# Patient Record
Sex: Male | Born: 2000 | Race: White | Hispanic: No | Marital: Single | Smoking: Never smoker
Health system: Southern US, Community
[De-identification: ages and names within clinical notes are randomized; demographics above are authoritative.]

## PROBLEM LIST (undated history)

## (undated) DIAGNOSIS — R519 Headache, unspecified: Secondary | ICD-10-CM

## (undated) DIAGNOSIS — T7840XA Allergy, unspecified, initial encounter: Secondary | ICD-10-CM

## (undated) DIAGNOSIS — R51 Headache: Secondary | ICD-10-CM

## (undated) DIAGNOSIS — R040 Epistaxis: Secondary | ICD-10-CM

## (undated) DIAGNOSIS — J45909 Unspecified asthma, uncomplicated: Secondary | ICD-10-CM

## (undated) HISTORY — DX: Allergy, unspecified, initial encounter: T78.40XA

## (undated) HISTORY — DX: Unspecified asthma, uncomplicated: J45.909

## (undated) HISTORY — PX: NO PAST SURGERIES: SHX2092

## (undated) HISTORY — DX: Epistaxis: R04.0

## (undated) HISTORY — DX: Headache, unspecified: R51.9

## (undated) HISTORY — DX: Headache: R51

---
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
## 2020-09-13 DIAGNOSIS — S53402A Unspecified sprain of left elbow, initial encounter: Secondary | ICD-10-CM | POA: Diagnosis not present

## 2020-09-20 DIAGNOSIS — Z23 Encounter for immunization: Secondary | ICD-10-CM | POA: Diagnosis not present

## 2021-01-05 DIAGNOSIS — H52223 Regular astigmatism, bilateral: Secondary | ICD-10-CM | POA: Diagnosis not present

## 2021-11-10 DIAGNOSIS — M542 Cervicalgia: Secondary | ICD-10-CM | POA: Diagnosis not present

## 2021-11-10 DIAGNOSIS — M25511 Pain in right shoulder: Secondary | ICD-10-CM | POA: Diagnosis not present

## 2021-11-23 DIAGNOSIS — M542 Cervicalgia: Secondary | ICD-10-CM | POA: Diagnosis not present

## 2021-11-23 DIAGNOSIS — M546 Pain in thoracic spine: Secondary | ICD-10-CM | POA: Diagnosis not present

## 2021-11-30 DIAGNOSIS — M546 Pain in thoracic spine: Secondary | ICD-10-CM | POA: Diagnosis not present

## 2021-11-30 DIAGNOSIS — M542 Cervicalgia: Secondary | ICD-10-CM | POA: Diagnosis not present

## 2021-12-07 DIAGNOSIS — M542 Cervicalgia: Secondary | ICD-10-CM | POA: Diagnosis not present

## 2021-12-07 DIAGNOSIS — M546 Pain in thoracic spine: Secondary | ICD-10-CM | POA: Diagnosis not present

## 2021-12-27 ENCOUNTER — Ambulatory Visit: Payer: 59 | Admitting: Family Medicine

## 2021-12-27 ENCOUNTER — Encounter: Payer: Self-pay | Admitting: Family Medicine

## 2021-12-27 VITALS — BP 110/72 | HR 70 | Temp 97.6°F | Ht 69.5 in | Wt 151.8 lb

## 2021-12-27 DIAGNOSIS — Z1329 Encounter for screening for other suspected endocrine disorder: Secondary | ICD-10-CM | POA: Diagnosis not present

## 2021-12-27 DIAGNOSIS — Z13220 Encounter for screening for lipoid disorders: Secondary | ICD-10-CM | POA: Diagnosis not present

## 2021-12-27 DIAGNOSIS — Z13228 Encounter for screening for other metabolic disorders: Secondary | ICD-10-CM | POA: Diagnosis not present

## 2021-12-27 DIAGNOSIS — Z23 Encounter for immunization: Secondary | ICD-10-CM

## 2021-12-27 DIAGNOSIS — Z130 Encounter for screening for diseases of the blood and blood-forming organs and certain disorders involving the immune mechanism: Secondary | ICD-10-CM

## 2021-12-27 DIAGNOSIS — Z1159 Encounter for screening for other viral diseases: Secondary | ICD-10-CM

## 2021-12-27 DIAGNOSIS — Z1321 Encounter for screening for nutritional disorder: Secondary | ICD-10-CM

## 2021-12-27 LAB — LIPID PANEL
Cholesterol: 115 mg/dL (ref 0–200)
HDL: 48.3 mg/dL (ref >39.00)
LDL Cholesterol: 59 mg/dL (ref 0–99)
NonHDL: 66.3
Total CHOL/HDL Ratio: 2
Triglycerides: 39 mg/dL (ref 0.0–149.0)
VLDL: 7.8 mg/dL (ref 0.0–40.0)

## 2021-12-27 LAB — BASIC METABOLIC PANEL
BUN: 23 mg/dL (ref 6–23)
CO2: 32 mEq/L (ref 19–32)
Calcium: 9.4 mg/dL (ref 8.4–10.5)
Chloride: 102 mEq/L (ref 96–112)
Creatinine, Ser: 0.91 mg/dL (ref 0.40–1.50)
GFR: 120.65 mL/min (ref >60.00)
Glucose, Bld: 77 mg/dL (ref 70–99)
Potassium: 4.3 mEq/L (ref 3.5–5.1)
Sodium: 138 mEq/L (ref 135–145)

## 2021-12-27 NOTE — Progress Notes (Signed)
Chief Complaint:  Clifford Stephens is a 21 y.o. male who presents today for his annual comprehensive physical exam.    Assessment/Plan:   Problem List Items Addressed This Visit   None Visit Diagnoses     Need for influenza vaccination    -  Primary   Relevant Orders   Flu Vaccine QUAD 6+ mos PF IM (Fluarix Quad PF)   Screening for endocrine, nutritional, metabolic and immunity disorder       Relevant Orders   Basic Metabolic Panel (BMET) (Completed)   Screening, lipid       Relevant Orders   Lipid Profile (Completed)   Screening for viral disease       Relevant Orders   Hepatitis C Antibody (Completed)   HIV antibody (with reflex) (Completed)        Patient Counseling(The following topics were reviewed and/or handout was given):  -Nutrition: Stressed importance of moderation in sodium/caffeine intake, saturated fat and cholesterol, caloric balance, sufficient intake of fresh fruits, vegetables, and fiber.  -Stressed the importance of regular exercise.   -Substance Abuse: Discussed cessation/primary prevention of tobacco, alcohol, or other drug use; driving or other dangerous activities under the influence; availability of treatment for abuse.   -Injury prevention: Discussed safety belts, safety helmets, smoke detector, smoking near bedding or upholstery.   -Sexuality: Discussed sexually transmitted diseases, partner selection, use of condoms, avoidance of unintended pregnancy and contraceptive alternatives.   -Dental health: Discussed importance of regular tooth brushing, flossing, and dental visits.  -Health maintenance and immunizations reviewed. Please refer to Health maintenance section.  Return to care in 1 year for next preventative visit.     Subjective:  HPI:  He has no acute complaints today.   Patient is here to establish care.  Patient goes to [place] [employer] to study Patent attorney.  He has mother is [place].  He speaks spanish.  He grew up in [place].   He is planning to go to [place] in the spring and complete his care.   Lifestyle Diet: Eats a healthy balanced diet vegetables Exercise: He has played baseball in the past, but he is currently focusing on his education     12/27/2021   10:48 AM  Depression screen PHQ 2/9  Decreased Interest 0  Down, Depressed, Hopeless 0  PHQ - 2 Score 0  Altered sleeping 0  Tired, decreased energy 0  Change in appetite 0  Feeling bad or failure about yourself  0  Trouble concentrating 0  Moving slowly or fidgety/restless 0  Suicidal thoughts 0  PHQ-9 Score 0  Difficult doing work/chores Not difficult at all    Health Maintenance Due  Topic Date Due   HPV VACCINES (1 - Male 2-dose series) Never done   DTaP/Tdap/Td (1 - Tdap) Never done   INFLUENZA VACCINE  08/30/2021     ROS: Denies chest pain or shortness of breath, otherwise all systems reviewed and are negative  PMH:  The following were reviewed and entered/updated in epic: Past Medical History:  Diagnosis Date   Allergy    Asthma    Epistaxis    Headache    There are no problems to display for this patient.  Past Surgical History:  Procedure Laterality Date   NO PAST SURGERIES      Family History  Problem Relation Age of Onset   Hypertension Mother    Hemangiomas Mother        cavernous   Hashimoto's thyroiditis Mother  Hypertension Maternal Grandmother    Hypertension Maternal Grandfather    Cancer Maternal Grandfather    Hypertension Paternal Grandmother    Hypertension Paternal Grandfather     Medications- reviewed and updated Current Outpatient Medications  Medication Sig Dispense Refill   loratadine (CLARITIN) 10 MG tablet Take 10 mg by mouth daily.     No current facility-administered medications for this visit.    Allergies-reviewed and updated Allergies  Allergen Reactions   Penicillins     Familial reaction anaphylaxis/death    Social History   Socioeconomic History   Marital status:  Single    Spouse name: n/a   Number of children: 0   Years of education: Not on file   Highest education level: Not on file  Occupational History   Occupation: student  Tobacco Use   Smoking status: Never    Passive exposure: Never   Smokeless tobacco: Never  Vaping Use   Vaping Use: Never used  Substance and Sexual Activity   Alcohol use: Yes    Comment: Once a week   Drug use: Yes    Types: Marijuana   Sexual activity: Never  Other Topics Concern   Not on file  Social History Narrative   Lives with both parents and two siblings.   Attends [employer].   If baseball doesn't work out, he'd like to become a Adult nurse or Radio producer.   Social Determinants of Health   Financial Resource Strain: Not on file  Food Insecurity: Not on file  Transportation Needs: Not on file  Physical Activity: Not on file  Stress: Not on file  Social Connections: Not on file        Objective:  Physical Exam: BP 110/72 (BP Location: Right Arm, Patient Position: Sitting, Cuff Size: Large)   Pulse 70   Temp 97.6 F (36.4 C) (Temporal)   Ht 5' 9.5" (1.765 m)   Wt 151 lb 12.8 oz (68.9 kg)   SpO2 98%   BMI 22.10 kg/m   Body mass index is 22.1 kg/m. Wt Readings from Last 3 Encounters:  12/27/21 151 lb 12.8 oz (68.9 kg)  10/18/16 149 lb 11.1 oz (67.9 kg) (71 %, Z= 0.56)*  03/13/16 141 lb 9.6 oz (64.2 kg) (69 %, Z= 0.48)*   * Growth percentiles are based on CDC (Boys, 2-20 Years) data.    Gen: NAD, resting comfortably CV: RRR with no murmurs appreciated Pulm: NWOB, CTAB with no crackles, wheezes, or rhonchi GI: Normal bowel sounds present. Soft, Nontender, Nondistended. MSK: no edema, cyanosis, or clubbing noted Skin: warm, dry Neuro: grossly normal, moves all extremities Psych: Normal affect and thought content      At today's visit, we discussed treatment options, associated risk and benefits, and engage in counseling as needed.  Additionally the  following were reviewed: Past medical records, past medical and surgical history, family and social background, as well as relevant laboratory results, imaging findings, and specialty notes, where applicable.  This message was generated using dictation software, and as a result, it may contain unintentional typos or errors.  Nevertheless, extensive effort was made to accurately convey at the pertinent aspects of the patient visit.    There may have been are other unrelated non-urgent complaints, but due to the busy schedule and the amount of time already spent with him, time does not permit to address these issues at today's visit. Another appointment may have or has been requested to review these additional issues.  Marny Lowenstein, MD, MS

## 2021-12-28 LAB — HIV ANTIBODY (ROUTINE TESTING W REFLEX): HIV 1&2 Ab, 4th Generation: NONREACTIVE

## 2021-12-28 LAB — HEPATITIS C ANTIBODY: Hepatitis C Ab: NONREACTIVE

## 2022-07-24 DIAGNOSIS — H52223 Regular astigmatism, bilateral: Secondary | ICD-10-CM | POA: Diagnosis not present

## 2023-01-01 ENCOUNTER — Ambulatory Visit (INDEPENDENT_AMBULATORY_CARE_PROVIDER_SITE_OTHER): Payer: BC Managed Care – PPO | Admitting: Family Medicine

## 2023-01-01 ENCOUNTER — Encounter: Payer: Self-pay | Admitting: Family Medicine

## 2023-01-01 VITALS — BP 126/74 | HR 60 | Temp 97.9°F | Ht 69.5 in | Wt 148.8 lb

## 2023-01-01 DIAGNOSIS — Z862 Personal history of diseases of the blood and blood-forming organs and certain disorders involving the immune mechanism: Secondary | ICD-10-CM | POA: Insufficient documentation

## 2023-01-01 DIAGNOSIS — Z0001 Encounter for general adult medical examination with abnormal findings: Secondary | ICD-10-CM | POA: Diagnosis not present

## 2023-01-01 DIAGNOSIS — S01331A Puncture wound without foreign body of right ear, initial encounter: Secondary | ICD-10-CM | POA: Insufficient documentation

## 2023-01-01 DIAGNOSIS — Z23 Encounter for immunization: Secondary | ICD-10-CM

## 2023-01-01 DIAGNOSIS — Z0000 Encounter for general adult medical examination without abnormal findings: Secondary | ICD-10-CM

## 2023-01-01 LAB — CBC WITH DIFFERENTIAL/PLATELET
Basophils Absolute: 0.1 10*3/uL (ref 0.0–0.1)
Basophils Relative: 1 % (ref 0.0–3.0)
Eosinophils Absolute: 0 10*3/uL (ref 0.0–0.7)
Eosinophils Relative: 0.8 % (ref 0.0–5.0)
HCT: 48.4 % (ref 39.0–52.0)
Hemoglobin: 16.4 g/dL (ref 13.0–17.0)
Lymphocytes Relative: 26 % (ref 12.0–46.0)
Lymphs Abs: 1.5 10*3/uL (ref 0.7–4.0)
MCHC: 33.9 g/dL (ref 30.0–36.0)
MCV: 86 fL (ref 78.0–100.0)
Monocytes Absolute: 0.5 10*3/uL (ref 0.1–1.0)
Monocytes Relative: 9 % (ref 3.0–12.0)
Neutro Abs: 3.5 10*3/uL (ref 1.4–7.7)
Neutrophils Relative %: 63.2 % (ref 43.0–77.0)
Platelets: 229 10*3/uL (ref 150.0–400.0)
RBC: 5.63 Mil/uL (ref 4.22–5.81)
RDW: 12.7 % (ref 11.5–15.5)
WBC: 5.6 10*3/uL (ref 4.0–10.5)

## 2023-01-01 LAB — COMPREHENSIVE METABOLIC PANEL
ALT: 17 U/L (ref 0–53)
AST: 18 U/L (ref 0–37)
Albumin: 4.8 g/dL (ref 3.5–5.2)
Alkaline Phosphatase: 82 U/L (ref 39–117)
BUN: 14 mg/dL (ref 6–23)
CO2: 29 meq/L (ref 19–32)
Calcium: 9.8 mg/dL (ref 8.4–10.5)
Chloride: 101 meq/L (ref 96–112)
Creatinine, Ser: 0.97 mg/dL (ref 0.40–1.50)
GFR: 110.96 mL/min (ref >60.00)
Glucose, Bld: 94 mg/dL (ref 70–99)
Potassium: 4 meq/L (ref 3.5–5.1)
Sodium: 138 meq/L (ref 135–145)
Total Bilirubin: 0.7 mg/dL (ref 0.2–1.2)
Total Protein: 8 g/dL (ref 6.0–8.3)

## 2023-01-01 LAB — HIGH SENSITIVITY CRP: CRP, High Sensitivity: <0.2 mg/L (ref 0.000–5.000)

## 2023-01-01 LAB — SEDIMENTATION RATE: Sed Rate: 2 mm/h (ref 0–15)

## 2023-01-01 MED ORDER — CIPROFLOXACIN HCL 250 MG PO TABS: 250.0000 mg | ORAL_TABLET | Freq: Two times a day (BID) | ORAL | 0 refills | Status: AC

## 2023-01-01 NOTE — Progress Notes (Signed)
Assessment  Assessment/Plan:   Problem List Items Addressed This Visit       Other   Complication of right ear piercing    5 months of persistent signs of localized infection at the site of right ear piercings, including swelling, redness, and purulent discharge upon pressure. No significant pain or systemic symptoms are reported.   Differential Diagnosis:  Localized cellulitis vs. abscess due to piercing Allergic reaction to piercing material vs foreign body reaction: Less likely as the patient has worn silver jewelry previously without issues.  Plan: Advised removal of right ear piercings to facilitate healing and prevent further infection. Ciprofloxacin 250?mg orally twice daily for 7 days. Labs to assess for underlying infection: CBC, ESR, CRP Instructed to avoid manipulating the affected area and maintain proper hygiene. Advised on the signs of worsening infection and the importance of prompt medical attention if symptoms escalate. ENT Consultation to be considered if no improvement is observed or if symptoms worsen despite treatment.      Relevant Medications   ciprofloxacin (CIPRO) 250 MG tablet   Other Relevant Orders   Sedimentation rate   CRP High sensitivity   CBC w/Diff   Comp Met (CMET)   History of polycythemia   Other Visit Diagnoses     Encounter for well adult exam without abnormal findings    -  Primary   Immunization due       Relevant Orders   Flu vaccine trivalent PF, 6mos and older(Flulaval,Afluria,Fluarix,Fluzone) (Completed)   Tdap vaccine greater than or equal to 7yo IM (Completed)       There are no discontinued medications.  Patient Counseling(The following topics were reviewed and/or handout was given):  -Nutrition: Stressed importance of moderation in sodium/caffeine intake, saturated fat and cholesterol, caloric balance, sufficient intake of fresh fruits, vegetables, and fiber.  -Stressed the importance of regular exercise.    -Substance Abuse: Discussed cessation/primary prevention of tobacco, alcohol, or other drug use; driving or other dangerous activities under the influence; availability of treatment for abuse.   -Injury prevention: Discussed safety belts, safety helmets, smoke detector, smoking near bedding or upholstery.   -Sexuality: Discussed sexually transmitted diseases, partner selection, use of condoms, avoidance of unintended pregnancy and contraceptive alternatives.   -Dental health: Discussed importance of regular tooth brushing, flossing, and dental visits.  -Health maintenance and immunizations reviewed. Please refer to Health maintenance section.  Return to care in 1 year for next preventative visit.        Subjective:  Chief complaint Encounter date: 01/01/2023  Chief Complaint  Patient presents with   Annual Exam    Fasting   Chief Complaint: Annual physical examination and concern about right ear infection.  History of Present Illness:  Patient presents for an annual physical examination and reports a concern about a possible infection at the site of right ear piercings. The piercings were obtained in May 2024. The patient notes onset of symptoms around July 2024, approximately five months ago. Symptoms include persistent swelling at the site of the upper ear piercings on the right ear. Minimal pain is reported, but the patient notices pus drainage when pressure is applied to the area. The condition has remained unchanged over the past several months. The patient has not removed the earrings due to concern about the piercings closing. Denies fever or chills but reports occasional hot sweats at night when unable to sleep. No history of similar reactions to silver jewelry.  Review of Systems:  Constitutional: Denies fever or chills.  Reports occasional hot sweats at night when unable to sleep. HEENT: Right ear swelling with pus drainage at piercing site. Denies significant pain. Skin:  Swelling and redness at the right ear piercing site. Remainder of ROS negative.  Health Maintenance:  Dental: Up to date; last dental visit in March 2024. Vision: Up to date; last eye examination two months ago.  Past Medical History:  Mildly elevated blood counts noted in past laboratory results in 2027.  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 Transferred from [employer], previously studied Patent attorney. Living Situation: Lives with significant other. Travel Plans: Planning to visit family in [place] in two weeks.       01/01/2023   10:09 AM 12/27/2021   10:48 AM 03/13/2016    5:16 PM 03/22/2015    2:16 PM 06/06/2014    3:29 PM  Depression screen PHQ 2/9  Decreased Interest 1 0 0 0 0  Down, Depressed, Hopeless 2 0 0 0 0  PHQ - 2 Score 3 0 0 0 0  Altered sleeping 2 0 0    Tired, decreased energy 1 0 0    Change in appetite 1 0 0    Feeling bad or failure about yourself  1 0 0    Trouble concentrating 2 0 0    Moving slowly or fidgety/restless 0 0 0    Suicidal thoughts 0 0 0    PHQ-9 Score 10 0 0    Difficult doing work/chores Not difficult at all Not difficult at all          01/01/2023   10:09 AM 12/27/2021   10:49 AM  GAD 7 : Generalized Anxiety Score  Nervous, Anxious, on Edge 3 0  Control/stop worrying 3 3  Worry too much - different things 3 3  Trouble relaxing 2 2  Restless 1 0  Easily annoyed or irritable 2 1  Afraid - awful might happen 1 0  Total GAD 7 Score 15 9  Anxiety Difficulty Not difficult at all Not difficult at all    There are no preventive care reminders to display for this patient.      PMH:  The following were reviewed and entered/updated in epic: Past Medical History:  Diagnosis Date   Allergy    Asthma    Epistaxis    Headache     Patient Active Problem List   Diagnosis Date Noted    Complication of right ear piercing 01/01/2023   History of polycythemia 01/01/2023    Past Surgical History:  Procedure Laterality Date   NO PAST SURGERIES      Family History  Problem Relation Age of Onset   Hypertension Mother    Hemangiomas Mother        cavernous   Hashimoto's thyroiditis Mother    Hypertension Maternal Grandmother    Hypertension Maternal Grandfather    Cancer Maternal Grandfather    Hypertension Paternal Grandmother    Hypertension Paternal Grandfather     Medications- reviewed and updated Outpatient Medications Prior to Visit  Medication Sig Dispense Refill   loratadine (CLARITIN) 10 MG tablet Take 10 mg by mouth daily.     No facility-administered medications prior to visit.    Allergies  Allergen Reactions   Penicillins     Familial reaction anaphylaxis/death    Social History   Socioeconomic History   Marital status: Single  Spouse name: n/a   Number of children: 0   Years of education: Not on file   Highest education level: Not on file  Occupational History   Occupation: student  Tobacco Use   Smoking status: Never    Passive exposure: Never   Smokeless tobacco: Never  Vaping Use   Vaping status: Never Used  Substance and Sexual Activity   Alcohol use: Yes    Comment: Once a week   Drug use: Yes    Types: Marijuana   Sexual activity: Never  Other Topics Concern   Not on file  Social History Narrative   Lives with both parents and two siblings.   Attends [employer].   If baseball doesn't work out, he'd like to become a Adult nurse or Radio producer.   Social Determinants of Health   Financial Resource Strain: Not on file  Food Insecurity: Not on file  Transportation Needs: Not on file  Physical Activity: Not on file  Stress: Not on file  Social Connections: Not on file           Objective:  Physical Exam: BP 126/74 (BP Location: Left Arm, Patient Position: Sitting, Cuff Size:  Large)   Pulse 60   Temp 97.9 F (36.6 C) (Temporal)   Ht 5' 9.5" (1.765 m)   Wt 148 lb 12.8 oz (67.5 kg)   SpO2 97%   BMI 21.66 kg/m   Body mass index is 21.66 kg/m. Wt Readings from Last 3 Encounters:  01/01/23 148 lb 12.8 oz (67.5 kg)  12/27/21 151 lb 12.8 oz (68.9 kg)  10/18/16 149 lb 11.1 oz (67.9 kg) (71%, Z= 0.56)*   * Growth percentiles are based on CDC (Boys, 2-20 Years) data.    Physical Exam Constitutional:      General: He is not in acute distress.    Appearance: Normal appearance. He is not ill-appearing or toxic-appearing.  HENT:     Head: Normocephalic and atraumatic.     Right Ear: Hearing, tympanic membrane, ear canal and external ear normal. Swelling present. No drainage. There is impacted cerumen.     Left Ear: Hearing, tympanic membrane, ear canal and external ear normal. There is no impacted cerumen.     Ears:     Comments: Mild swelling and redness on posterior ear at the site of upper ear piercings. Mild tenderness noted, especially at the back of the ear.     Nose: Nose normal. No congestion.     Mouth/Throat:     Lips: No lesions.     Mouth: Mucous membranes are moist.     Pharynx: Oropharynx is clear. No oropharyngeal exudate.  Eyes:     General: No scleral icterus.       Right eye: No discharge.        Left eye: No discharge.     Conjunctiva/sclera: Conjunctivae normal.     Pupils: Pupils are equal, round, and reactive to light.  Neck:     Thyroid: No thyroid mass, thyromegaly or thyroid tenderness.  Cardiovascular:     Rate and Rhythm: Normal rate and regular rhythm.     Pulses: Normal pulses.     Heart sounds: Normal heart sounds.  Pulmonary:     Effort: Pulmonary effort is normal. No respiratory distress.     Breath sounds: Normal breath sounds.  Abdominal:     General: Abdomen is flat. Bowel sounds are normal.     Palpations: Abdomen is soft.  Musculoskeletal:  General: Normal range of motion.     Cervical back: Normal range  of motion.     Right lower leg: No edema.     Left lower leg: No edema.  Lymphadenopathy:     Cervical: No cervical adenopathy.  Skin:    General: Skin is warm and dry.     Findings: No rash.  Neurological:     General: No focal deficit present.     Mental Status: He is alert and oriented to person, place, and time. Mental status is at baseline.     Deep Tendon Reflexes:     Reflex Scores:      Patellar reflexes are 2+ on the right side and 2+ on the left side. Psychiatric:        Mood and Affect: Mood normal.        Behavior: Behavior normal.        Thought Content: Thought content normal.        Judgment: Judgment normal.        Prior labs:   No results found for this or any previous visit (from the past 2160 hour(s)).  Lab Results  Component Value Date   CHOL 115 12/27/2021   Lab Results  Component Value Date   HDL 48.30 12/27/2021   Lab Results  Component Value Date   LDLCALC 59 12/27/2021   Lab Results  Component Value Date   TRIG 39.0 12/27/2021   Lab Results  Component Value Date   CHOLHDL 2 12/27/2021   No results found for: "LDLDIRECT"  Last metabolic panel Lab Results  Component Value Date   GLUCOSE 77 12/27/2021   NA 138 12/27/2021   K 4.3 12/27/2021   CL 102 12/27/2021   CO2 32 12/27/2021   BUN 23 12/27/2021   CREATININE 0.91 12/27/2021   GFR 120.65 12/27/2021   CALCIUM 9.4 12/27/2021   PROT 7.0 03/22/2015   ALBUMIN 4.4 03/22/2015   BILITOT 0.4 03/22/2015   ALKPHOS 273 03/22/2015   AST 29 03/22/2015   ALT 16 03/22/2015   ANIONGAP 6 08/09/2015    No results found for: "HGBA1C"  Last CBC Lab Results  Component Value Date   WBC 5.1 08/09/2015   HGB 15.6 (H) 08/09/2015   HCT 44.6 (H) 08/09/2015   MCV 81.1 08/09/2015   MCH 28.4 08/09/2015   RDW 12.7 08/09/2015   PLT 179 08/09/2015    Lab Results  Component Value Date   TSH 2.67 03/22/2015     Last vitamin D No results found for: "25OHVITD2", "25OHVITD3", "VD25OH"  No  results found for: "COLORU", "CLARITYU", "GLUCOSEUR", "BILIRUBINUR", "KETONESU", "SPECGRAV", "RBCUR", "PHUR", "PROTEINUR", "UROBILINOGEN", "LEUKOCYTESUR"  No results found for: "LABMICR", "MICROALBUR"   At today's visit, we discussed treatment options, associated risk and benefits, and engage in counseling as needed.  Additionally the following were reviewed: Past medical records, past medical and surgical history, family and social background, as well as relevant laboratory results, imaging findings, and specialty notes, where applicable.  This message was generated using dictation software, and as a result, it may contain unintentional typos or errors.  Nevertheless, extensive effort was made to accurately convey at the pertinent aspects of the patient visit.    There may have been are other unrelated non-urgent complaints, but due to the busy schedule and the amount of time already spent with him, time does not permit to address these issues at today's visit. Another appointment may have or has been requested to review these additional issues.  Thomes Dinning, MD, MS

## 2023-01-01 NOTE — Assessment & Plan Note (Signed)
5 months of persistent signs of localized infection at the site of right ear piercings, including swelling, redness, and purulent discharge upon pressure. No significant pain or systemic symptoms are reported.   Differential Diagnosis:  Localized cellulitis vs. abscess due to piercing Allergic reaction to piercing material vs foreign body reaction: Less likely as the patient has worn silver jewelry previously without issues.  Plan: Advised removal of right ear piercings to facilitate healing and prevent further infection. Ciprofloxacin 250?mg orally twice daily for 7 days. Labs to assess for underlying infection: CBC, ESR, CRP Instructed to avoid manipulating the affected area and maintain proper hygiene. Advised on the signs of worsening infection and the importance of prompt medical attention if symptoms escalate. ENT Consultation to be considered if no improvement is observed or if symptoms worsen despite treatment.

## 2023-01-01 NOTE — Patient Instructions (Signed)
For ear infection, remove ear rings and do not place back into ear. Start antibiotic ciprofloxacin 250 mg twice a day for 7 days.  Follow-up in 7 days to reassess your.  If no improvement in the next 3 days, please notify the office and we will refer you to your doctor for further assessment.  If severe symptoms of swelling, pain, drainage, fevers, chills or other worrisome symptoms occur, please go to the emergency department.

## 2023-01-08 ENCOUNTER — Ambulatory Visit: Payer: BC Managed Care – PPO | Admitting: Family Medicine

## 2023-01-08 ENCOUNTER — Encounter: Payer: Self-pay | Admitting: Family Medicine

## 2023-01-08 VITALS — BP 120/68 | HR 70 | Temp 97.5°F | Wt 153.4 lb

## 2023-01-08 DIAGNOSIS — S01331D Puncture wound without foreign body of right ear, subsequent encounter: Secondary | ICD-10-CM | POA: Diagnosis not present

## 2023-01-08 NOTE — Assessment & Plan Note (Signed)
Significant improvement completing ciprofloxacin and removing the earrings.   Plan:  Advised the patient to continue monitoring the site for any signs of recurrence. Encouraged maintaining proper ear hygiene. Instructed to avoid re-piercing the area until fully healed. Cleared for upcoming travel to [place] on January 13, 2023. Advised to seek medical attention if symptoms recur or worsen, including increased pain, swelling, redness, or discharge.

## 2023-01-08 NOTE — Progress Notes (Signed)
Assessment/Plan:   Problem List Items Addressed This Visit       Other   Complication of right ear piercing - Primary    Significant improvement completing ciprofloxacin and removing the earrings.   Plan:  Advised the patient to continue monitoring the site for any signs of recurrence. Encouraged maintaining proper ear hygiene. Instructed to avoid re-piercing the area until fully healed. Cleared for upcoming travel to [place] on January 13, 2023. Advised to seek medical attention if symptoms recur or worsen, including increased pain, swelling, redness, or discharge.       There are no discontinued medications.  No follow-ups on file.    Subjective:   Encounter date: 01/08/2023  Clifford Stephens is a 22 y.o. male who has Complication of right ear piercing and History of polycythemia on their problem list..   He  has a past medical history of Allergy, Asthma, Epistaxis, and Headache..   Chief Complaint: Follow-up for right ear infection after antibiotic therapy.  History of Present Illness:  Patient returns for follow-up regarding the right ear infection at the site of previous piercings. The patient reports that he has completed the course of Ciprofloxacin 250?mg orally twice daily for 7 days as prescribed. He has removed the piercings as advised. The patient notes significant improvement with decreased swelling and redness of the right ear. Denies any pain, pus, or drainage from the site. Tolerated antibiotics well without any adverse effects. Planning to leave for [place] on Saturday, January 13, 2023.  Review of Systems:  Constitutional: Denies fever or chills. HEENT: Denies pain, pus, or drainage from right ear. Remainder of ROS negative and unchanged from previous visit.    Past Surgical History:  Procedure Laterality Date   NO PAST SURGERIES      Outpatient Medications Prior to Visit  Medication Sig Dispense Refill   ciprofloxacin (CIPRO) 250 MG tablet Take 1  tablet (250 mg total) by mouth 2 (two) times daily for 7 days. 14 tablet 0   loratadine (CLARITIN) 10 MG tablet Take 10 mg by mouth daily.     No facility-administered medications prior to visit.    Family History  Problem Relation Age of Onset   Hypertension Mother    Hemangiomas Mother        cavernous   Hashimoto's thyroiditis Mother    Hypertension Maternal Grandmother    Hypertension Maternal Grandfather    Cancer Maternal Grandfather    Hypertension Paternal Grandmother    Hypertension Paternal Grandfather     Social History   Socioeconomic History   Marital status: Single    Spouse name: n/a   Number of children: 0   Years of education: Not on file   Highest education level: Not on file  Occupational History   Occupation: student  Tobacco Use   Smoking status: Never    Passive exposure: Never   Smokeless tobacco: Never  Vaping Use   Vaping status: Never Used  Substance and Sexual Activity   Alcohol use: Yes    Comment: Once a week   Drug use: Yes    Types: Marijuana   Sexual activity: Never  Other Topics Concern   Not on file  Social History Narrative   Lives with both parents and two siblings.   Attends [employer].   If baseball doesn't work out, he'd like to become a Adult nurse or Radio producer.   Social Determinants of Health   Financial Resource Strain: Not on file  Food Insecurity: Not on file  Transportation Needs: Not on file  Physical Activity: Not on file  Stress: Not on file  Social Connections: Not on file  Intimate Partner Violence: Not on file                                                                                                  Objective:  Physical Exam: BP 120/68 (BP Location: Right Arm, Patient Position: Sitting, Cuff Size: Large)   Pulse 70   Temp (!) 97.5 F (36.4 C) (Temporal)   Wt 153 lb 6.4 oz (69.6 kg)   SpO2 97%   BMI 22.33 kg/m     Physical Exam Constitutional:       Appearance: Normal appearance.  HENT:     Head: Normocephalic and atraumatic.     Right Ear: Hearing and external ear normal.     Left Ear: Hearing and external ear normal.     Nose: Nose normal.  Eyes:     General: No scleral icterus.       Right eye: No discharge.        Left eye: No discharge.     Extraocular Movements: Extraocular movements intact.  Cardiovascular:     Rate and Rhythm: Normal rate and regular rhythm.     Heart sounds: Normal heart sounds.  Pulmonary:     Effort: Pulmonary effort is normal.     Breath sounds: Normal breath sounds.  Abdominal:     Palpations: Abdomen is soft.     Tenderness: There is no abdominal tenderness.  Skin:    General: Skin is warm.     Findings: No rash.  Neurological:     General: No focal deficit present.     Mental Status: He is alert.     Cranial Nerves: No cranial nerve deficit.  Psychiatric:        Mood and Affect: Mood normal.        Behavior: Behavior normal.        Thought Content: Thought content normal.        Judgment: Judgment normal.     No results found.  Recent Results (from the past 2160 hour(s))  Sedimentation rate     Status: None   Collection Time: 01/01/23 10:47 AM  Result Value Ref Range   Sed Rate 2 0 - 15 mm/hr  CRP High sensitivity     Status: None   Collection Time: 01/01/23 10:47 AM  Result Value Ref Range   CRP, High Sensitivity <0.200 0.000 - 5.000 mg/L    Comment: Note:  An elevated hs-CRP (>5 mg/L) should be repeated after 2 weeks to rule out recent infection or trauma.  CBC w/Diff     Status: None   Collection Time: 01/01/23 10:47 AM  Result Value Ref Range   WBC 5.6 4.0 - 10.5 K/uL   RBC 5.63 4.22 - 5.81 Mil/uL   Hemoglobin 16.4 13.0 - 17.0 g/dL   HCT 91.4 78.2 - 95.6 %   MCV 86.0 78.0 - 100.0 fl   MCHC 33.9 30.0 - 36.0 g/dL  RDW 12.7 11.5 - 15.5 %   Platelets 229.0 150.0 - 400.0 K/uL   Neutrophils Relative % 63.2 43.0 - 77.0 %   Lymphocytes Relative 26.0 12.0 - 46.0 %    Monocytes Relative 9.0 3.0 - 12.0 %   Eosinophils Relative 0.8 0.0 - 5.0 %   Basophils Relative 1.0 0.0 - 3.0 %   Neutro Abs 3.5 1.4 - 7.7 K/uL   Lymphs Abs 1.5 0.7 - 4.0 K/uL   Monocytes Absolute 0.5 0.1 - 1.0 K/uL   Eosinophils Absolute 0.0 0.0 - 0.7 K/uL   Basophils Absolute 0.1 0.0 - 0.1 K/uL  Comp Met (CMET)     Status: None   Collection Time: 01/01/23 10:47 AM  Result Value Ref Range   Sodium 138 135 - 145 mEq/L   Potassium 4.0 3.5 - 5.1 mEq/L   Chloride 101 96 - 112 mEq/L   CO2 29 19 - 32 mEq/L   Glucose, Bld 94 70 - 99 mg/dL   BUN 14 6 - 23 mg/dL   Creatinine, Ser 8.41 0.40 - 1.50 mg/dL   Total Bilirubin 0.7 0.2 - 1.2 mg/dL   Alkaline Phosphatase 82 39 - 117 U/L   AST 18 0 - 37 U/L   ALT 17 0 - 53 U/L   Total Protein 8.0 6.0 - 8.3 g/dL   Albumin 4.8 3.5 - 5.2 g/dL   GFR 660.63 >01.60 mL/min    Comment: Calculated using the CKD-EPI Creatinine Equation (2021)   Calcium 9.8 8.4 - 10.5 mg/dL        Garner Nash, MD, MS
